# Patient Record
Sex: Male | Born: 1995 | Hispanic: Refuse to answer | Marital: Single | Smoking: Never smoker
Health system: Southern US, Community
[De-identification: ages and names within clinical notes are randomized; demographics above are authoritative.]

## PROBLEM LIST (undated history)

## (undated) DIAGNOSIS — K5900 Constipation, unspecified: Secondary | ICD-10-CM

## (undated) DIAGNOSIS — Z00129 Encounter for routine child health examination without abnormal findings: Secondary | ICD-10-CM

## (undated) HISTORY — DX: Constipation, unspecified: K59.00

## (undated) HISTORY — PX: WISDOM TOOTH EXTRACTION: SHX21

## (undated) HISTORY — PX: WRIST SURGERY: SHX841

## (undated) HISTORY — DX: Encounter for routine child health examination without abnormal findings: Z00.129

---
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
## 2015-06-17 DIAGNOSIS — J019 Acute sinusitis, unspecified: Secondary | ICD-10-CM | POA: Diagnosis not present

## 2015-06-17 DIAGNOSIS — J069 Acute upper respiratory infection, unspecified: Secondary | ICD-10-CM | POA: Diagnosis not present

## 2017-11-03 ENCOUNTER — Ambulatory Visit (INDEPENDENT_AMBULATORY_CARE_PROVIDER_SITE_OTHER): Payer: 59 | Admitting: Surgery

## 2017-11-03 ENCOUNTER — Encounter: Payer: Self-pay | Admitting: Surgery

## 2017-11-03 VITALS — BP 117/70 | HR 82 | Temp 98.3°F | Ht 65.0 in | Wt 161.0 lb

## 2017-11-03 DIAGNOSIS — R1031 Right lower quadrant pain: Secondary | ICD-10-CM

## 2017-11-03 DIAGNOSIS — K5900 Constipation, unspecified: Secondary | ICD-10-CM | POA: Diagnosis not present

## 2017-11-03 HISTORY — DX: Constipation, unspecified: K59.00

## 2017-11-03 NOTE — Progress Notes (Signed)
Surgical Clinic History and Physical  Referring provider:  Jolene ProvostPatel, Kirtida, MD [address] [place] [place], [place] [id number]  HISTORY OF PRESENT ILLNESS (HPI):  22 y.o. male presents for evaluation of RLQ abdominal pain. Patient reports he first developed RLQ abdominal pain 1 week ago (last Thursday) [place] on spring break from [employer]. Patient reports he felt constipated at that time without any diarrhea, N/V, fever/chills, CP, or SOB. At that time, he presented to [facility] ED, underwent CT abdominal-pelvic imaging, and was discharged home without surgery or antibiotics, but describes having been told the CT could not exclude the possibility of appendicitis. Upon returning to school with improved intermittent RLQ abdominal pain and this history, he was referred by school RN for further evaluation.  PAST MEDICAL HISTORY (PMH):  Past Medical History:  Diagnosis Date  . Healthy male adolescent      PAST SURGICAL HISTORY [facility](PSH):  Past Surgical History:  Procedure Laterality Date  . WISDOM TOOTH EXTRACTION    . WRIST SURGERY Right      MEDICATIONS:  Prior to Admission medications   Not on File     ALLERGIES:  Allergies  Allergen Reactions  . Penicillins      SOCIAL HISTORY:  Social History   Socioeconomic History  . Marital status: Single    Spouse name: Not on file  . Number of children: Not on file  . Years of education: Not on file  . Highest education level: Not on file  Occupational History  . Not on file  Social Needs  . Financial resource strain: Not on file  . Food insecurity:    Worry: Not on file    Inability: Not on file  . Transportation needs:    Medical: Not on file    Non-medical: Not on file  Tobacco Use  . Smoking status: Never Smoker  . Smokeless tobacco: Never Used  Substance and Sexual Activity  . Alcohol use: Yes    Comment: ocassional  . Drug use: Never  . Sexual activity: Not on file   Lifestyle  . Physical activity:    Days per week: Not on file    Minutes per session: Not on file  . Stress: Not on file  Relationships  . Social connections:    Talks on phone: Not on file    Gets together: Not on file    Attends religious service: Not on file    Active member of club or organization: Not on file    Attends meetings of clubs or organizations: Not on file    Relationship status: Not on file  . Intimate partner violence:    Fear of current or ex partner: Not on file    Emotionally abused: Not on file    Physically abused: Not on file    Forced sexual activity: Not on file  Other Topics Concern  . Not on file  Social History Narrative  . Not on file    The patient currently resides (home / rehab facility / nursing home): Home The patient normally is (ambulatory / bedbound): Ambulatory  FAMILY HISTORY:  Family History  Problem Relation Age of Onset  . Healthy Mother   . Healthy Father   . Skin cancer Paternal Grandfather     Otherwise negative/non-contributory.  REVIEW OF SYSTEMS:  Constitutional: denies any other weight loss, fever, chills, or sweats  Eyes: denies any other vision changes, history of eye injury  ENT:  denies sore throat, hearing problems  Respiratory: denies shortness of breath, wheezing  Cardiovascular: denies chest pain, palpitations  Gastrointestinal: abdominal pain, N/V, and bowel function as per HPI Musculoskeletal: denies any other joint pains or cramps  Skin: Denies any other rashes or skin discolorations Neurological: denies any other headache, dizziness, weakness  Psychiatric: Denies any other depression, anxiety   All other review of systems were otherwise negative   VITAL SIGNS:  BP 117/70   Pulse 82   Temp 98.3 F (36.8 C) (Oral)   Ht 5\' 5"  (1.651 m)   Wt 161 lb (73 kg)   BMI 26.79 kg/m   PHYSICAL EXAM:  Constitutional:  -- Normal body habitus  -- Awake, alert, and oriented x3  Eyes:  -- Pupils equally round  and reactive to light  -- No scleral icterus  Ear, nose, throat:  -- No jugular venous distension -- No nasal drainage, bleeding Pulmonary:  -- No crackles  -- Equal breath sounds bilaterally -- Breathing non-labored at rest Cardiovascular:  -- S1, S2 present  -- No pericardial rubs  Gastrointestinal:  -- Abdomen soft and non-distended with mild Right-sided abdominal tenderness to deep palpation, no guarding/rebound tenderness -- No abdominal masses appreciated, pulsatile or otherwise  Musculoskeletal and Integumentary:  -- Wounds or skin discoloration: None appreciated -- Extremities: B/L UE and LE FROM, hands and feet warm, no edema  Neurologic:  -- Motor function: Intact and symmetric -- Sensation: Intact and symmetric  Labs: CBC: No results found for: WBC, RBC BMP: No results found for: GLUCOSE, POTASSIUM, CHLORIDE, CO2, BUN, CREATININE, CALCIUM   Labs from [facility], [place] (2017/11/07): Cr 1.2, WBC 10.9 (CBC and CMP otherwise WNL)   Imaging studies:  CT Abdomen and Pelvis (11/07/17 at [facility], [place]) - per report, no images available for review No focal acute pathology. There is moderate constipation.   Assessment/Plan:  22 y.o. male with intermittent RLQ > Right-sided abdominal pain x 1 week, attributed to constipation with no radiographic evidence of appendicitis, no leukocytosis or fever, and no worsening of symptoms despite no surgery or antibiotics x 1 week.    - maintain adequate hydration and high-fiber diet  - once daily Colace stool softener to reduce risk of constipation (stop if loose BM's)   - if clinically significant/symptomatic constipation, consider Miralax daily until once daily BM's  - return to clinic as needed, instructed to call if any questions or concerns  - no indication for surgical intervention at this time  All of the above recommendations were discussed with the patient, and all of patient's questions were answered to  his expressed satisfaction.  Thank you for the opportunity to participate in this patient's care.  -- Scherrie Gerlach Earlene Plater, MD, RPVI [place]: [facility] General Surgery - Partnering for exceptional care. Office: [phone]

## 2017-11-03 NOTE — Patient Instructions (Signed)
Please give us a call in case you have questions or concerns. 

## 2017-11-14 ENCOUNTER — Emergency Department: Payer: 59

## 2017-11-14 ENCOUNTER — Emergency Department
Admission: EM | Admit: 2017-11-14 | Discharge: 2017-11-14 | Disposition: A | Discharge status: Home / Self Care | Payer: 59 | Attending: Emergency Medicine | Admitting: Emergency Medicine

## 2017-11-14 ENCOUNTER — Encounter: Payer: Self-pay | Admitting: Emergency Medicine

## 2017-11-14 DIAGNOSIS — R1031 Right lower quadrant pain: Secondary | ICD-10-CM | POA: Insufficient documentation

## 2017-11-14 LAB — COMPREHENSIVE METABOLIC PANEL
ALK PHOS: 81 U/L (ref 38–126)
ALT: 17 U/L (ref 17–63)
ANION GAP: 7 (ref 5–15)
AST: 25 U/L (ref 15–41)
Albumin: 4.5 g/dL (ref 3.5–5.0)
BUN: 17 mg/dL (ref 6–20)
CALCIUM: 9.3 mg/dL (ref 8.9–10.3)
CHLORIDE: 99 mmol/L — AB (ref 101–111)
CO2: 30 mmol/L (ref 22–32)
CREATININE: 1.38 mg/dL — AB (ref 0.61–1.24)
GFR calc Af Amer: >60 mL/min (ref >60)
GFR calc non Af Amer: >60 mL/min (ref >60)
GLUCOSE: 99 mg/dL (ref 65–99)
Potassium: 4.2 mmol/L (ref 3.5–5.1)
SODIUM: 136 mmol/L (ref 135–145)
Total Bilirubin: 0.8 mg/dL (ref 0.3–1.2)
Total Protein: 7.6 g/dL (ref 6.5–8.1)

## 2017-11-14 LAB — CBC
HCT: 45.6 % (ref 40.0–52.0)
HEMOGLOBIN: 15.4 g/dL (ref 13.0–18.0)
MCH: 30.2 pg (ref 26.0–34.0)
MCHC: 33.7 g/dL (ref 32.0–36.0)
MCV: 89.7 fL (ref 80.0–100.0)
Platelets: 264 10*3/uL (ref 150–440)
RBC: 5.09 MIL/uL (ref 4.40–5.90)
RDW: 13 % (ref 11.5–14.5)
WBC: 9.1 10*3/uL (ref 3.8–10.6)

## 2017-11-14 LAB — LIPASE, BLOOD: LIPASE: 23 U/L (ref 11–51)

## 2017-11-14 MED ORDER — DICYCLOMINE HCL 20 MG PO TABS: 20.0000 mg | ORAL_TABLET | Freq: Three times a day (TID) | ORAL | 0 refills | Status: AC | PRN

## 2017-11-14 MED ORDER — IOHEXOL 300 MG/ML  SOLN
100.0000 mL | Freq: Once | INTRAMUSCULAR | Status: AC | PRN
  Administered 2017-11-14: 100 mL via INTRAVENOUS
  Filled 2017-11-14: qty 100

## 2017-11-14 NOTE — ED Triage Notes (Signed)
Pt comes into the ED via POV c/o RLQ abdominal pain that causes nausea.  Patient had a CT done in another hospital and they told him it was negative, but the [place] MD's told himt hat if it happens again, he should be evaluated.  Patient states the pain has been back again for 2 days.  Denies any diarrhea, vomiting, back pain.  Patient in NAD at this time with even and unlabored respirations.  Patient states its worse when he bends over, but denies seeing any bulge in his abdomen.  Patient does explain that he feels as though his lower abdomen has been more bloated than normal.

## 2017-11-14 NOTE — ED Provider Notes (Signed)
[facility] Emergency Department Provider Note       Time seen: ----------------------------------------- 9:15 PM on 11/14/2017 -----------------------------------------   I have reviewed the triage vital signs and the nursing notes.  HISTORY   Chief Complaint Abdominal Pain    HPI Alan Turner is a 22 y.o. male with a history of constipation who presents to the ED for right lower quadrant pain that is been present for 2 weeks.  Patient states is constant and nonradiating, he has had diarrhea as well.  Pain is currently 4 5 out of 10 and is gotten worse a few hours ago which is why he came to the ER.  He denies any vomiting, denies other complaints.  Past Medical History:  Diagnosis Date  . Constipation 11/03/2017  . Healthy male adolescent     Patient Active Problem List   Diagnosis Date Noted  . Constipation 11/03/2017  . Concussion 05/25/2011    Past Surgical History:  Procedure Laterality Date  . WISDOM TOOTH EXTRACTION    . WRIST SURGERY Right     Allergies Amoxicillin  Social History Social History   Tobacco Use  . Smoking status: Never Smoker  . Smokeless tobacco: Never Used  Substance Use Topics  . Alcohol use: Yes    Comment: ocassional  . Drug use: Never   Review of Systems Constitutional: Negative for fever. Cardiovascular: Negative for chest pain. Respiratory: Negative for shortness of breath. Gastrointestinal: Positive for abdominal pain Musculoskeletal: Negative for back pain. Skin: Negative for rash. Neurological: Negative for headaches, focal weakness or numbness.  All systems negative/normal/unremarkable except as stated in the HPI  ____________________________________________   PHYSICAL EXAM:  VITAL SIGNS: ED Triage Vitals [11/14/17 2052]  Enc Vitals Group     BP (!) 151/113     Pulse Rate 76     Resp 15     Temp 98.2 F (36.8 C)     Temp Source Oral     SpO2 100 %     Weight 165 lb (74.8 kg)   Height 5\' 6"  (1.676 m)     Head Circumference      Peak Flow      Pain Score 4     Pain Loc      Pain Edu?      Excl. in GC?    Constitutional: Alert and oriented. Well appearing and in no distress. Eyes: Conjunctivae are normal. Normal extraocular movements. ENT   Head: Normocephalic and atraumatic.   Nose: No congestion/rhinnorhea.   Mouth/Throat: Mucous membranes are moist.   Neck: No stridor. Cardiovascular: Normal rate, regular rhythm. No murmurs, rubs, or gallops. Respiratory: Normal respiratory effort without tachypnea nor retractions. Breath sounds are clear and equal bilaterally. No wheezes/rales/rhonchi. Gastrointestinal: Right lower quadrant tenderness, no rebound or guarding.  Normal bowel sounds. Musculoskeletal: Nontender with normal range of motion in extremities. No lower extremity tenderness nor edema. Neurologic:  Normal speech and language. No gross focal neurologic deficits are appreciated.  Skin:  Skin is warm, dry and intact. No rash noted. Psychiatric: Mood and affect are normal. Speech and behavior are normal.  ____________________________________________  ED COURSE:  As part of my medical decision making, I reviewed the following data within the electronic MEDICAL RECORD NUMBER History obtained from family if available, nursing notes, old chart and ekg, as well as notes from prior ED visits. Patient presented for abdominal pain, we will assess with labs and imaging as indicated at this time.   Procedures ____________________________________________  LABS (pertinent positives/negatives)  Labs Reviewed  COMPREHENSIVE METABOLIC PANEL - Abnormal; Notable for the following components:      Result Value   Chloride 99 (*)    Creatinine, Ser 1.38 (*)    All other components within normal limits  LIPASE, BLOOD  CBC  URINALYSIS, COMPLETE (UACMP) WITH MICROSCOPIC    RADIOLOGY Images were viewed by me  CT the abdomen pelvis with contrast Is  unremarkable ____________________________________________  DIFFERENTIAL DIAGNOSIS   Muscle strain, appendicitis, renal colic, gas pain  FINAL ASSESSMENT AND PLAN  Right lower quadrant pain   Plan: The patient had presented for right lower quadrant pain of uncertain etiology. Patient's labs are normal. Patient's imaging was negative, I will refer him to GI for outpatient follow-up.   Ulice DashJohnathan E Rayhaan Huster, MD   Note: This note was generated in part or whole with voice recognition software. Voice recognition is usually quite accurate but there are transcription errors that can and very often do occur. I apologize for any typographical errors that were not detected and corrected.     Emily FilbertWilliams, Buddy Loeffelholz E, MD 11/14/17 2228

## 2017-11-14 NOTE — ED Notes (Signed)
Patient to ED with complaints of RLQ pain that has been present for two weeks. States it is constant and nonradiating. States he has had diarrhea as well. Pain currently is 4-5/10 and got much worse a few hours ago which is why he came to ER. Patient drove himself to ED. Denies vomiting in the last week. Had emesis at onset two weeks ago. Patient is able to ambulate without difficulty but states it makes the pain worse. Patient is currently an [employer] student.

## 2019-02-26 IMAGING — CT CT ABD-PELV W/ CM
2 of 4 series · 16 of 46 positions shown, 18 images · IV contrast (omnipaque)
Comparison: None.

CLINICAL DATA: Right lower quadrant abdominal pain

EXAM:
CT ABDOMEN AND PELVIS WITH CONTRAST
TECHNIQUE: Multidetector CT imaging of the abdomen and pelvis was performed
using the standard protocol following bolus administration of
intravenous contrast.
CONTRAST:  100mL OMNIPAQUE IOHEXOL 300 MG/ML  SOLN

[Series 2: routine abd/pel with · axial · 0.62mm/px · z∈[-542,-152]mm · 13 of 86 slices shown, 15 images]
[im 4/86  soft-tissue]
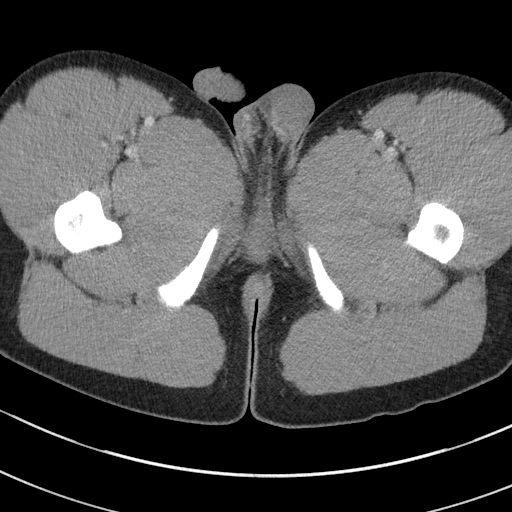
[im 4/86  bone]
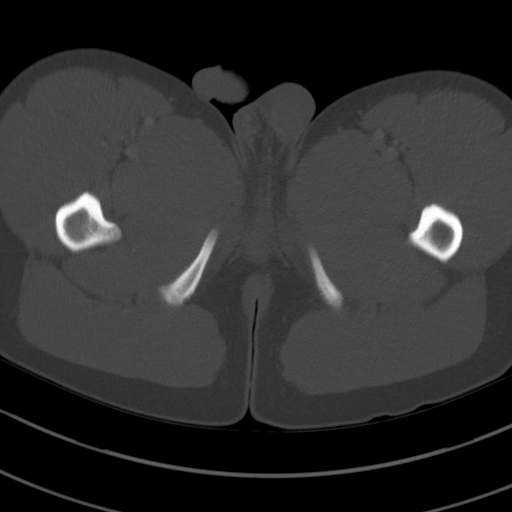
[im 11/86  soft-tissue]
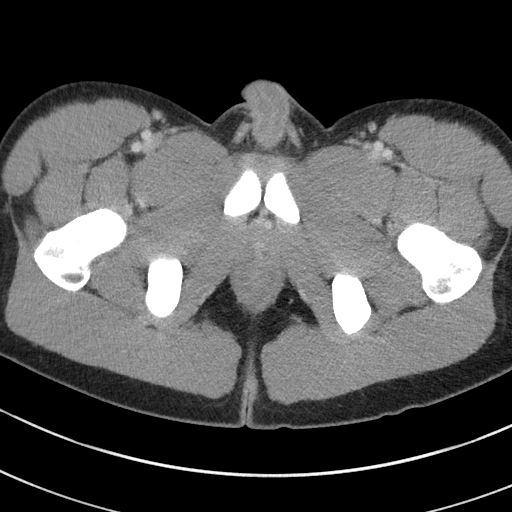
[im 18/86  soft-tissue]
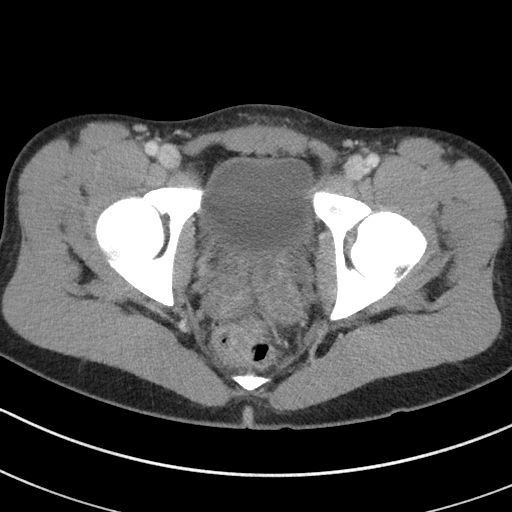
[im 25/86  soft-tissue]
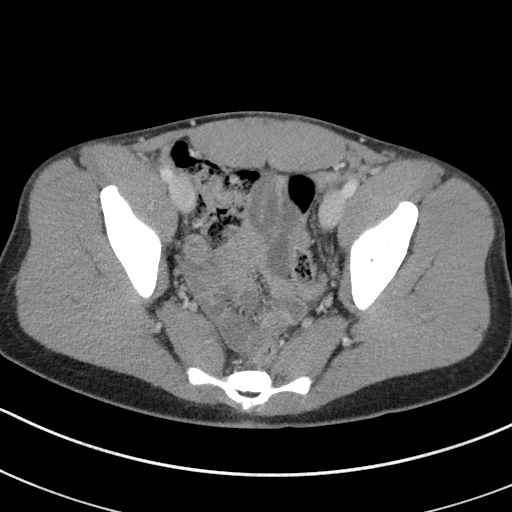
[im 29/86  soft-tissue]
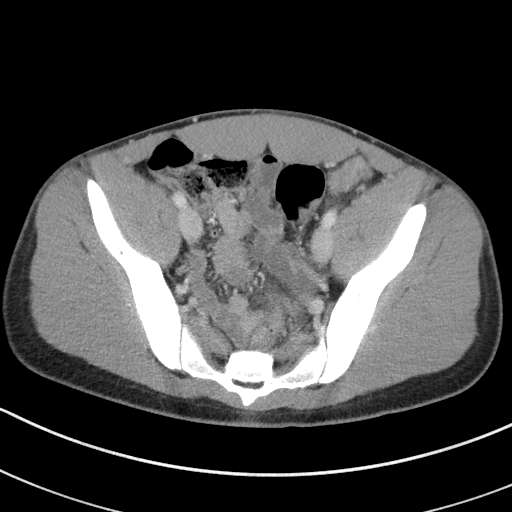
[im 36/86  soft-tissue]
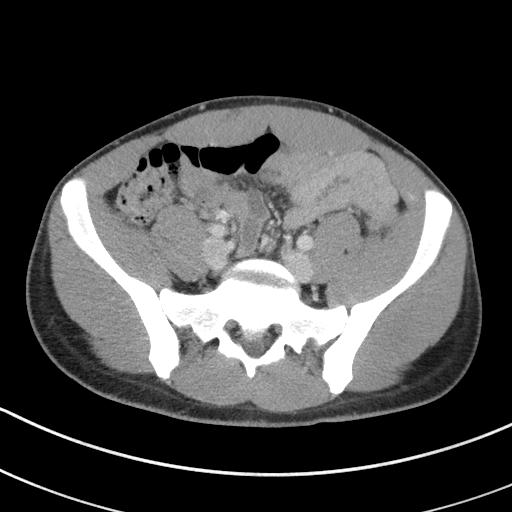
[im 43/86  soft-tissue]
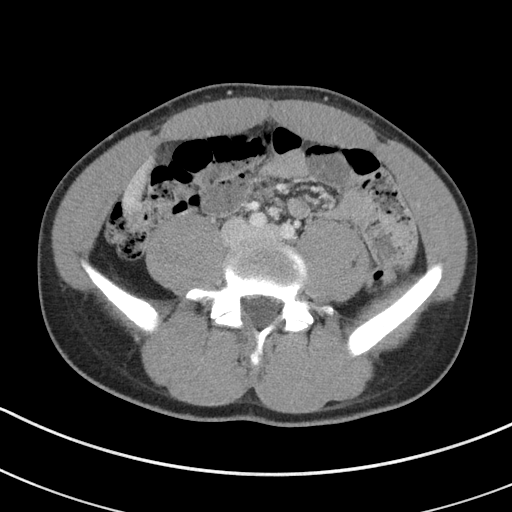
[im 50/86  soft-tissue]
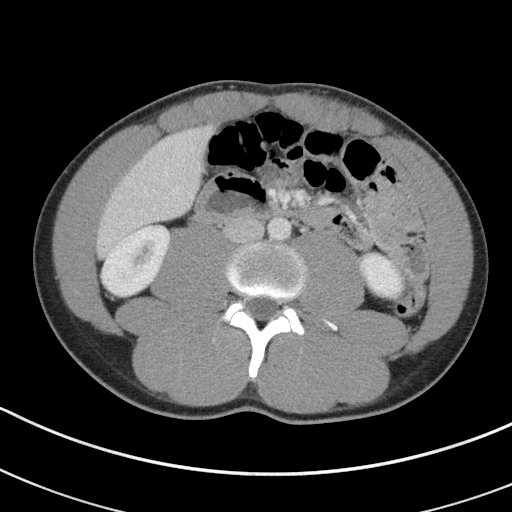
[im 57/86  soft-tissue]
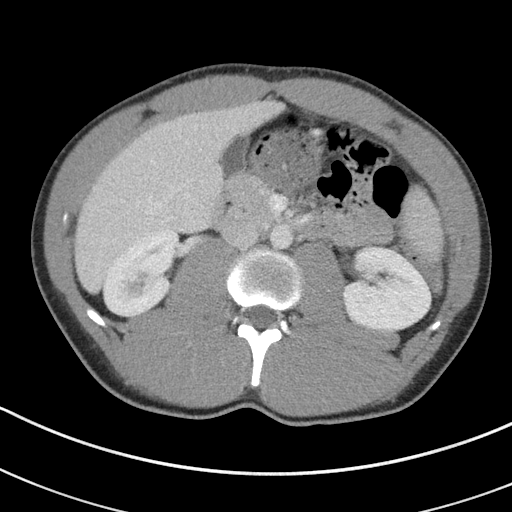
[im 57/86  bone]
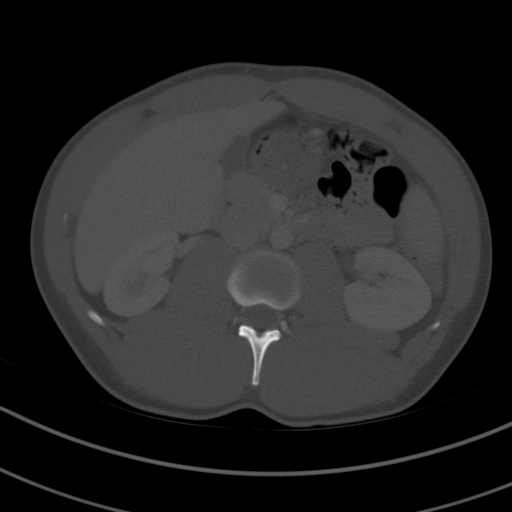
[im 61/86  soft-tissue]
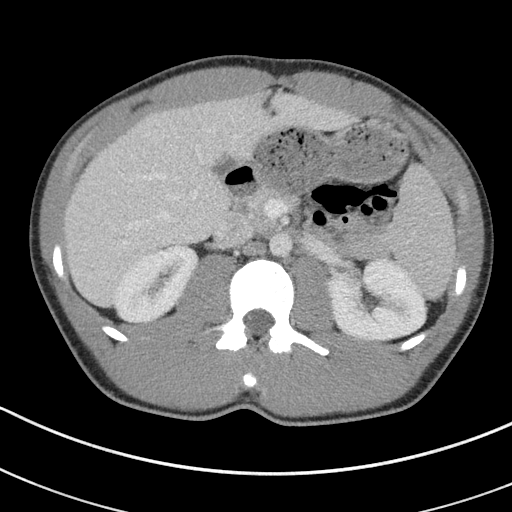
[im 68/86  soft-tissue]
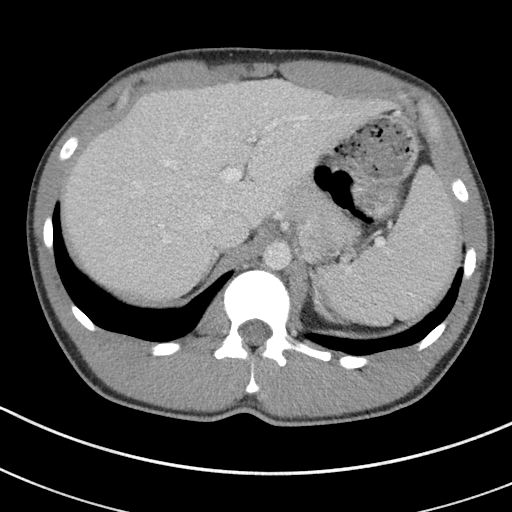
[im 75/86  soft-tissue]
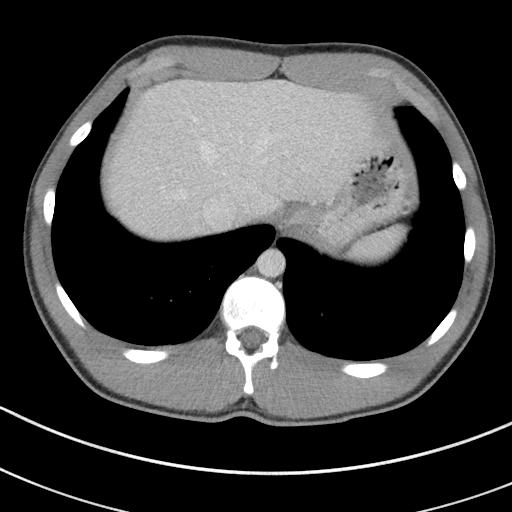
[im 82/86  soft-tissue]
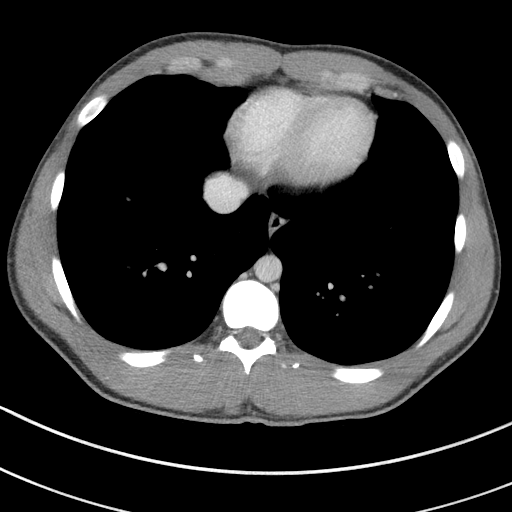

[Series 5: coronal st · coronal · 0.64mm/px · 3 of 74 slices shown]
[im 25/74  soft-tissue]
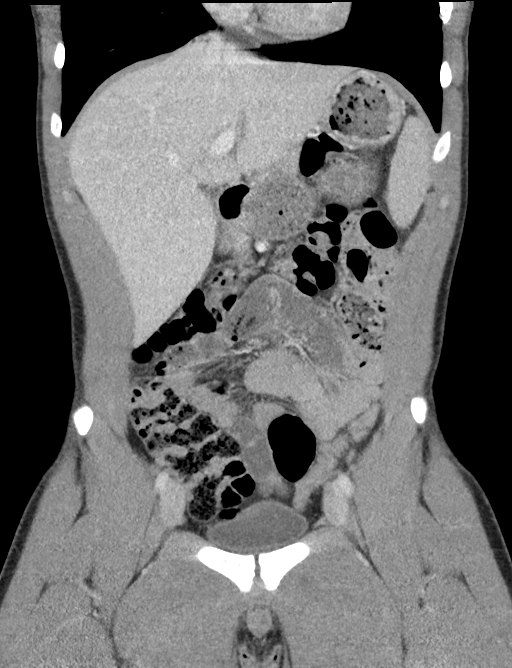
[im 33/74  soft-tissue]
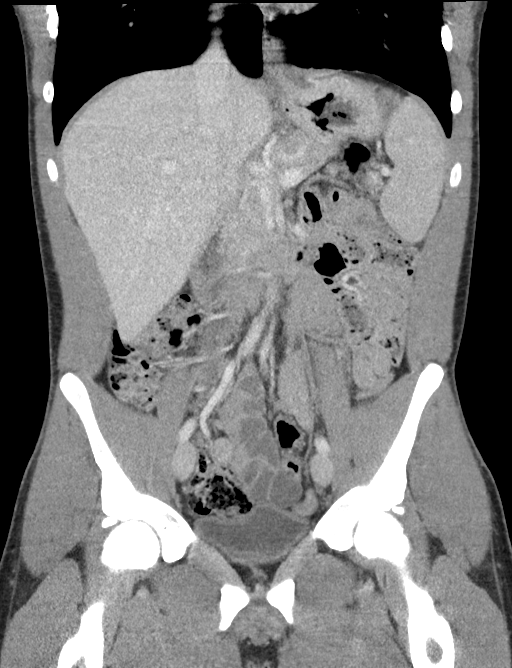
[im 41/74  soft-tissue]
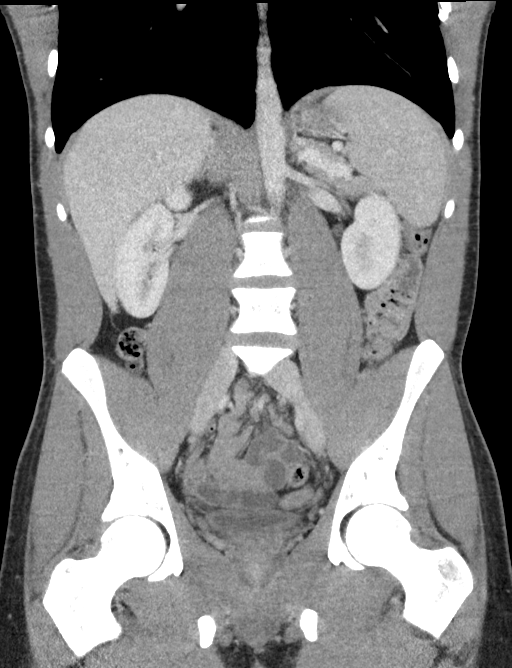

[16 of 46 positions shown; findings below may reference images not displayed]

FINDINGS: Lower chest: Lung bases are clear.

Hepatobiliary: Liver is within normal limits.

Gallbladder is unremarkable. No intrahepatic or extrahepatic ductal
dilatation.

Pancreas: Within normal limits.

Spleen: Within normal limits.

Adrenals/Urinary Tract: Adrenal glands are within normal limits.

Kidneys are within normal limits.  No hydronephrosis.

Bladder is within normal limits.

Stomach/Bowel: Stomach is within normal limits.

No evidence of bowel obstruction.

Normal appendix (series 2/image 56).

Vascular/Lymphatic: No evidence of abdominal aortic aneurysm.

No suspicious abdominopelvic lymphadenopathy.

Reproductive: Prostate is unremarkable.

Other: No abdominopelvic ascites.

Musculoskeletal: Visualized osseous structures are within normal
limits.
IMPRESSION: Normal CT abdomen/pelvis.

No evidence of appendicitis.
